# Patient Record
Sex: Female | Born: 1950
Health system: Western US, Academic
[De-identification: ages and names within clinical notes are randomized; demographics above are authoritative.]

---

## 2015-01-03 ENCOUNTER — Telehealth (HOSPITAL_BASED_OUTPATIENT_CLINIC_OR_DEPARTMENT_OTHER): Payer: Self-pay | Admitting: Gastroenterology

## 2015-01-03 NOTE — Telephone Encounter (Signed)
(  TEXTING IS AN OPTION FOR [facility] ONLY)  Is this a [facility] clinic? No      RETURN CALL: Detailed message on voicemail only      SUBJECT:  General Message     REASON FOR REQUEST: Patient requests Dr Barbara Cower    MESSAGE: Patient had referral sent but wants to see Dr Barbara Cower. She would like a call back to discuss. Please follow up with patient. Thank you

## 2015-02-23 ENCOUNTER — Other Ambulatory Visit (HOSPITAL_BASED_OUTPATIENT_CLINIC_OR_DEPARTMENT_OTHER): Payer: Self-pay | Admitting: Gastroenterology

## 2015-02-23 DIAGNOSIS — B181 Chronic viral hepatitis B without delta-agent: Secondary | ICD-10-CM

## 2015-02-26 ENCOUNTER — Encounter (HOSPITAL_BASED_OUTPATIENT_CLINIC_OR_DEPARTMENT_OTHER): Payer: Self-pay

## 2015-02-26 ENCOUNTER — Ambulatory Visit: Payer: No Typology Code available for payment source | Attending: Gastroenterology | Admitting: Gastroenterology

## 2015-02-26 ENCOUNTER — Other Ambulatory Visit (HOSPITAL_BASED_OUTPATIENT_CLINIC_OR_DEPARTMENT_OTHER): Payer: Self-pay | Admitting: Gastroenterology

## 2015-02-26 ENCOUNTER — Ambulatory Visit (HOSPITAL_BASED_OUTPATIENT_CLINIC_OR_DEPARTMENT_OTHER): Payer: No Typology Code available for payment source

## 2015-02-26 VITALS — BP 145/81 | HR 47 | Temp 97.3°F | Ht 65.5 in | Wt 170.6 lb

## 2015-02-26 DIAGNOSIS — B181 Chronic viral hepatitis B without delta-agent: Secondary | ICD-10-CM | POA: Insufficient documentation

## 2015-02-26 DIAGNOSIS — B1910 Unspecified viral hepatitis B without hepatic coma: Secondary | ICD-10-CM | POA: Insufficient documentation

## 2015-02-26 DIAGNOSIS — N281 Cyst of kidney, acquired: Secondary | ICD-10-CM | POA: Insufficient documentation

## 2015-02-26 LAB — CONFIRMATION SAMPLE INFO:

## 2015-02-26 LAB — BASIC METABOLIC PANEL
Anion Gap: 5 (ref 4–12)
Calcium: 9.4 mg/dL (ref 8.9–10.2)
Carbon Dioxide, Total: 32 meq/L (ref 22–32)
Chloride: 102 meq/L (ref 98–108)
Creatinine: 0.7 mg/dL (ref 0.38–1.02)
GFR, Calc, African American: >60 mL/min (ref >59)
GFR, Calc, European American: >60 mL/min (ref >59)
Glucose: 97 mg/dL (ref 62–125)
Potassium: 3.2 meq/L — ABNORMAL LOW (ref 3.6–5.2)
Sodium: 139 meq/L (ref 135–145)
Urea Nitrogen: 12 mg/dL (ref 8–21)

## 2015-02-26 LAB — HEPATITIS B ABS - HBCAB,HBSAB
Hepatitis B Core Ab: REACTIVE — AB
Hepatitis B Surf Antibody Intl Units: <8 [IU]
Hepatitis B Surface Ab: NONREACTIVE

## 2015-02-26 LAB — IRON BINDING CAPACITY (W/IRON, TRANSFERRIN & TRANSF SAT)
Iron, SRM: 91 ug/dL (ref 31–171)
Total Iron Binding Capacity: 371 ug/dL (ref 270–535)
Transferrin Saturation: 25 % (ref 10–45)
Transferrin: 265 mg/dL (ref 192–382)

## 2015-02-26 LAB — CBC (HEMOGRAM)
Hematocrit: 40 % (ref 36–45)
Hemoglobin: 13.4 g/dL (ref 11.5–15.5)
MCH: 30.6 pg (ref 27.3–33.6)
MCHC: 33.3 g/dL (ref 32.2–36.5)
MCV: 92 fL (ref 81–98)
Platelet Count: 213 10*3/uL (ref 150–400)
RBC: 4.38 10*6/uL (ref 3.80–5.00)
RDW-CV: 11.4 % — ABNORMAL LOW (ref 11.6–14.4)
WBC: 5.5 10*3/uL (ref 4.3–10.0)

## 2015-02-26 LAB — HEPATIC FUNCTION PANEL
ALT (GPT): 16 U/L (ref 7–33)
AST (GOT): 20 U/L (ref 9–38)
Albumin: 4.1 g/dL (ref 3.5–5.2)
Alkaline Phosphatase (Total): 47 U/L (ref 31–132)
Bilirubin (Direct): 0.2 mg/dL (ref 0.0–0.3)
Bilirubin (Total): 1 mg/dL (ref 0.2–1.3)
Protein (Total): 6.9 g/dL (ref 6.0–8.2)

## 2015-02-26 LAB — HEPATITIS B SURFACE AG & AB
Hepatitis B Surf Antibody Intl Units: <8 [IU]
Hepatitis B Surface Ab: NONREACTIVE
Hepatitis B Surface Antigen w/Reflex: REACTIVE — AB

## 2015-02-26 LAB — FERRITIN: Ferritin: 159 ng/mL (ref 10–180)

## 2015-02-26 LAB — HEPATITIS B SURF AG CONF-NEUT
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
## 2015-02-26 NOTE — Patient Instructions (Addendum)
-   You have positive surface antigen, but no signs of advanced liver disease.    - Because of the small, but real cancer risk, we suggest checking your virus counts and also have a liver ultrasound.    - consider minimizing alcohol even more and watch body weight    - We will call you with the results and decide on the follow up plan.

## 2015-02-27 ENCOUNTER — Telehealth (HOSPITAL_BASED_OUTPATIENT_CLINIC_OR_DEPARTMENT_OTHER): Payer: Self-pay

## 2015-02-27 LAB — ALPHA FETOPROTEIN, NONMATERNAL: Alpha Fetoprotein (Nonmaternal): 6 ng/mL (ref 0.0–8.5)

## 2015-02-27 NOTE — Telephone Encounter (Signed)
Left message for patient with the results. Told her we would call her as soon as the Hep B viral load results.

## 2015-02-27 NOTE — Telephone Encounter (Signed)
-----   Message from Caralee AtesLei Yu, MD sent at 02/27/2015 11:46 AM PDT -----  Please let Tiffany Pierce know that her ultrasound was completely normal. We are still waiting on her hbv pcr result. Thanks!

## 2015-02-28 LAB — HEPATITIS B QUANTITATIVE
HBV DNA IU/mL (Log 10): 2.3 {Log}
Hepatitis B Quant Result: 200 [IU]/mL — AB

## 2015-02-28 LAB — HEPATITIS B E AG AND AB BY EIA
Hepatitis B e Ab: POSITIVE
Hepatitis B e Ag: NEGATIVE

## 2015-03-02 ENCOUNTER — Telehealth (HOSPITAL_BASED_OUTPATIENT_CLINIC_OR_DEPARTMENT_OTHER): Payer: Self-pay | Admitting: Gastroenterology

## 2015-03-02 LAB — HEPATITIS B QUANTITATIVE

## 2015-03-02 NOTE — Telephone Encounter (Signed)
Ley Dr. Cathie HoopsYu know that pt's Hep B PCR is resulted and is 200. Per Dr. Cathie HoopsYu, he will call the patient.

## 2015-03-02 NOTE — Telephone Encounter (Signed)
Tiffany Pierce called into the clinic to ask if her lab results are in yet.  She says she received a call from Tiffany Pierce on Monday and is just following up on that call.  She wants to make sure her PCP gets the results by her upcoming appointment on Monday.    I looked up the orders and told the patient that the results were in for all of the tests ordered on 10/17.  I told her that I was not qualified to explain any results but not to worry about them being analyzed in time.      She said she would like to be called back today at [phone] after 2pm.    I also told her that she can view test results on [facility] and will provide her with a temporary login, emailed to Tiffany Pierce.  I told her this was a good solution in case she does not hear back today.    Routing to Hepatology clinical pool.  Thanks.

## 2015-03-10 ENCOUNTER — Other Ambulatory Visit: Payer: Self-pay

## 2019-08-05 ENCOUNTER — Encounter (HOSPITAL_COMMUNITY): Payer: Self-pay

## 2019-08-06 NOTE — Historical Transplant Communications (Signed)
[place], Burger [phone])  Transplant Communications  ____________________________________________________________________________________    Date: 02/28/2015  Type: Medical info  Entry By: Stevan Born  Re:  outside note to scan of type  - Outside Referrals: Scanned referral records to [facility] 02/26/2015.  ____________________________________________________________________________________    Date: 02/05/2015  Type: Action  Entry By: ccalhoun  Appt sched for 02/26/15 at 1:00pm in [facility].  ____________________________________________________________________________________    Date: 10/19/2014  Type: Action  Entry By: ccalhoun  Chart filed in closed section  ____________________________________________________________________________________    Date: 10/19/2014  Type: Action  Entry By: ccalhoun  Fax no contact close referral letter to ref MD  ____________________________________________________________________________________    Date: 10/19/2014  Type: Action  Entry By: ccalhoun  CCR attempts x 3  ____________________________________________________________________________________    Date: 07/28/2014  Type: Action  Entry By: Elisabeth Pigeon  Delivered to [facility].  ____________________________________________________________________________________    Date: 07/26/2014  Type: Referral  Entry By: Elisabeth Pigeon  Processed 1st liver referral, patient notified and confirmation sent.

## 2021-01-09 ENCOUNTER — Telehealth (HOSPITAL_BASED_OUTPATIENT_CLINIC_OR_DEPARTMENT_OTHER): Payer: Self-pay | Admitting: Gastroenterology

## 2021-01-09 NOTE — Telephone Encounter (Signed)
RETURN CALL: Voicemail - Detailed Message      SUBJECT:  Urgent per caller  Appointment Request     REASON FOR VISIT: High Bilirubin    PREFERRED DATE/TIME: 02/05/2021-02/07/2021    ADDITIONAL INFORMATION:     Previous patient of Dr Bethanne Ginger. Patient now resides in [facility]. She's asking if it would be possible for her to see Dr Cathie Hoops when she's in town between the dates: 02/05/2021-02/07/2021?Marland Kitchen     Please contact patient to advise.    Thank you

## 2021-01-11 NOTE — Telephone Encounter (Signed)
Patient was calling to check on status of possible appointment.

## 2022-08-01 IMAGING — MR MRI LUMBAR WITHOUT CONTRAST
2 of 8 series · 5 of 48 positions shown · non-contrast
Comparison: Lumbar radiograph June 27, 2022

MRI LUMBAR WITHOUT CONTRAST , 08/01/2022 [DATE]: 
CLINICAL INDICATION: Scoliosis with primarily lower back pain.
TECHNIQUE: Multiplanar, multiecho position MR images of the lumbar spine were 
performed without contrast.

[Series 2: T2 · sagittal · 4.0mm · 0.26mm/px · 3 of 20 slices shown (1 of 2)]
[im 1/20]
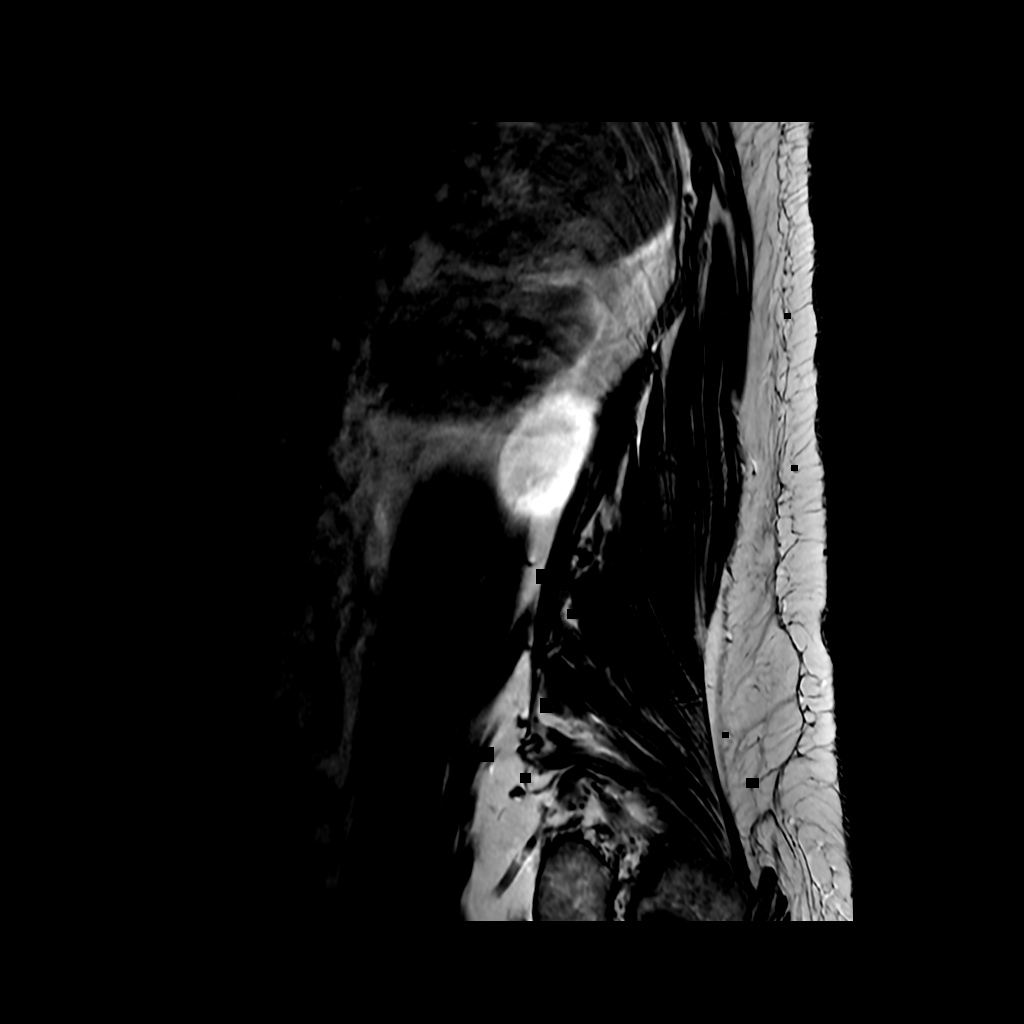
[im 13/20]
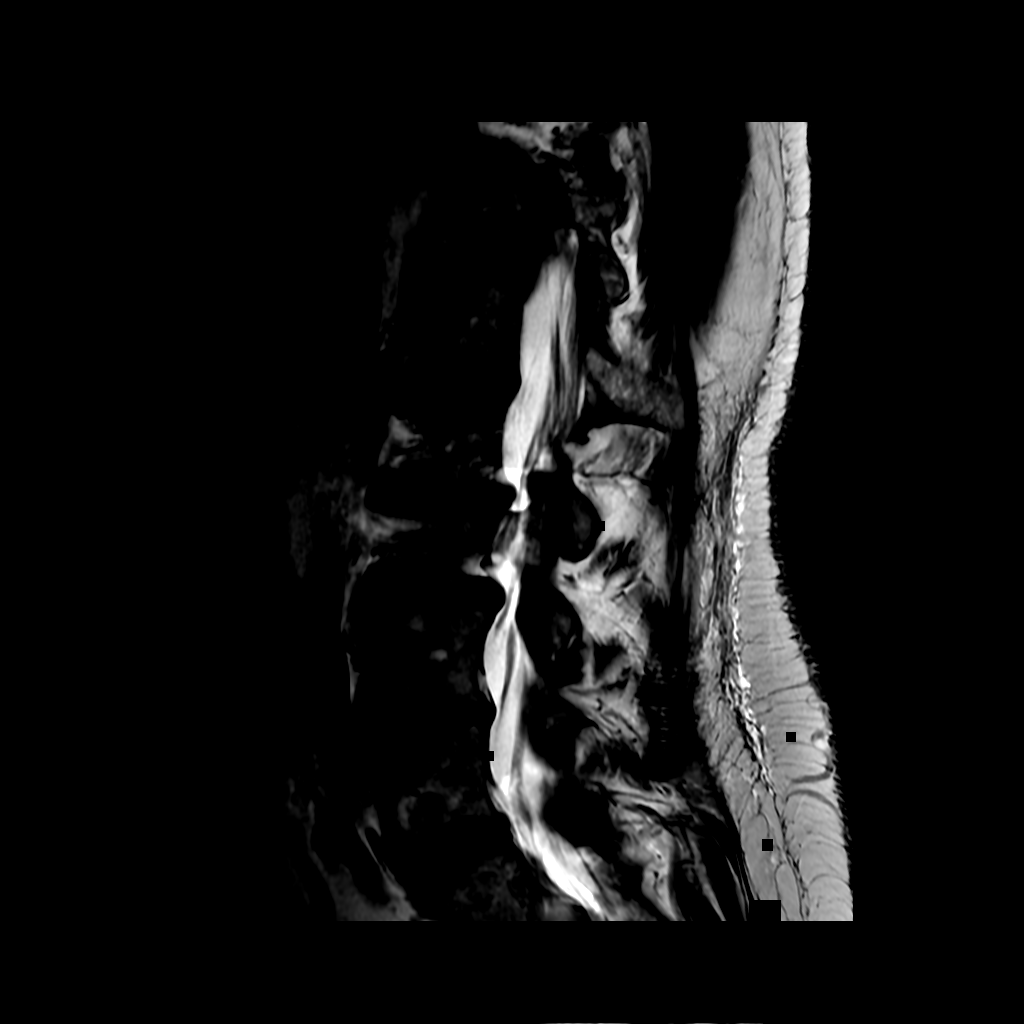
[im 20/20]
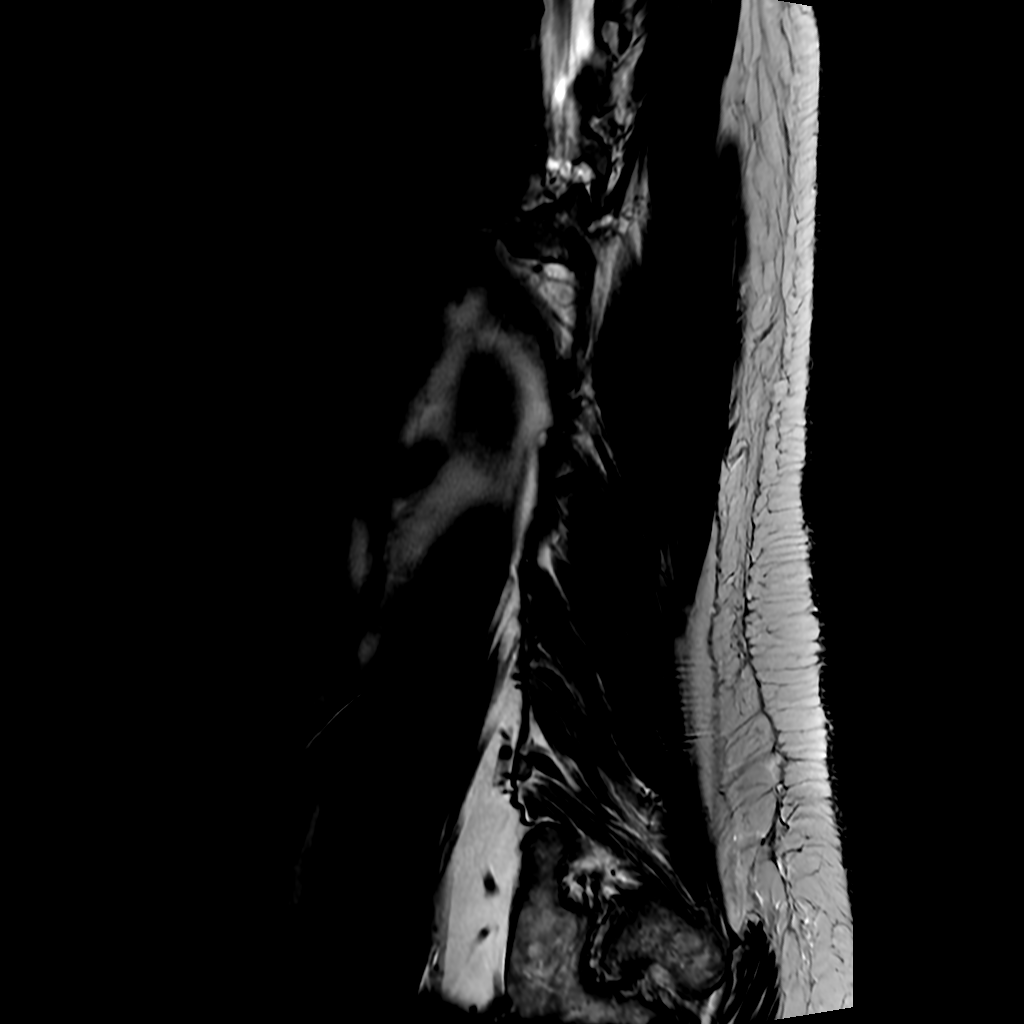

[Series 5: T2 · axial · 4.0mm · 0.18mm/px · z∈[+113,+178]mm · 2 of 35 slices shown (2 of 2)]
[im 5/35]
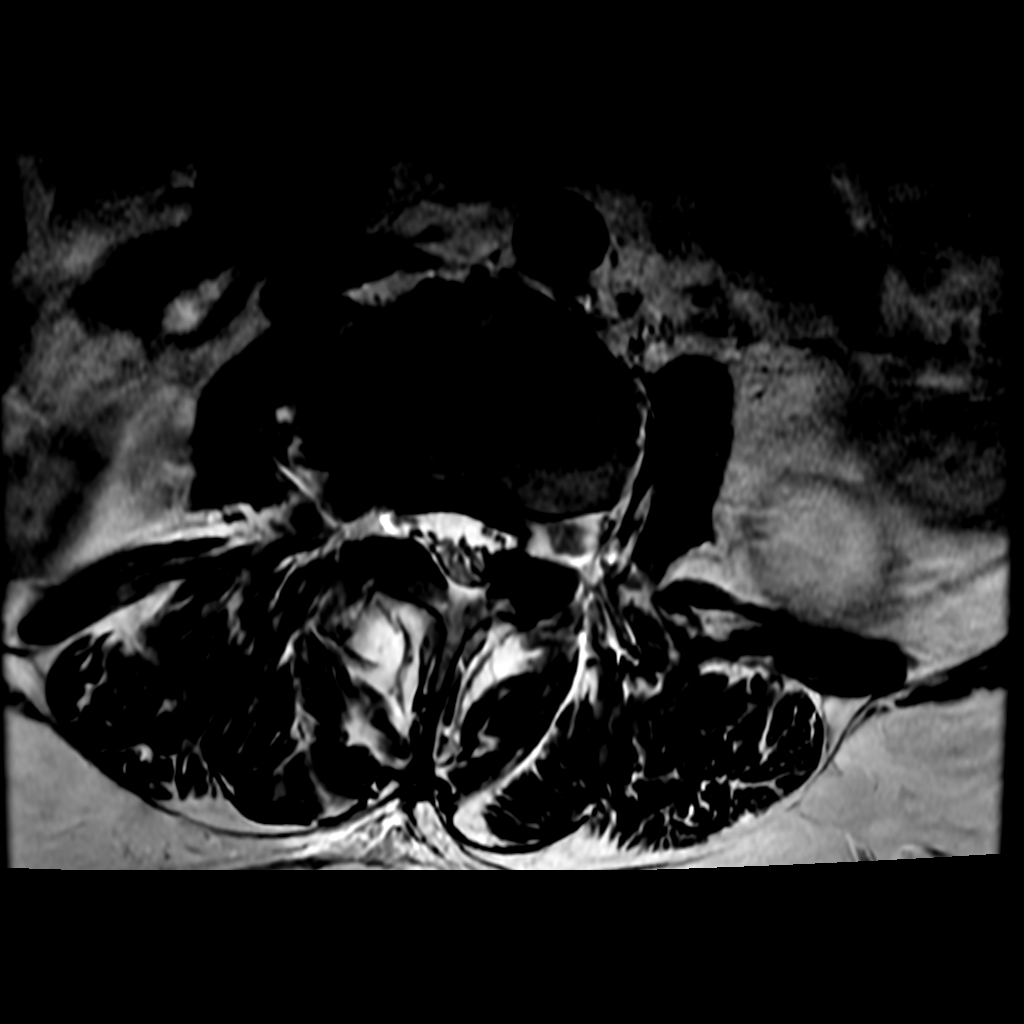
[im 20/35]
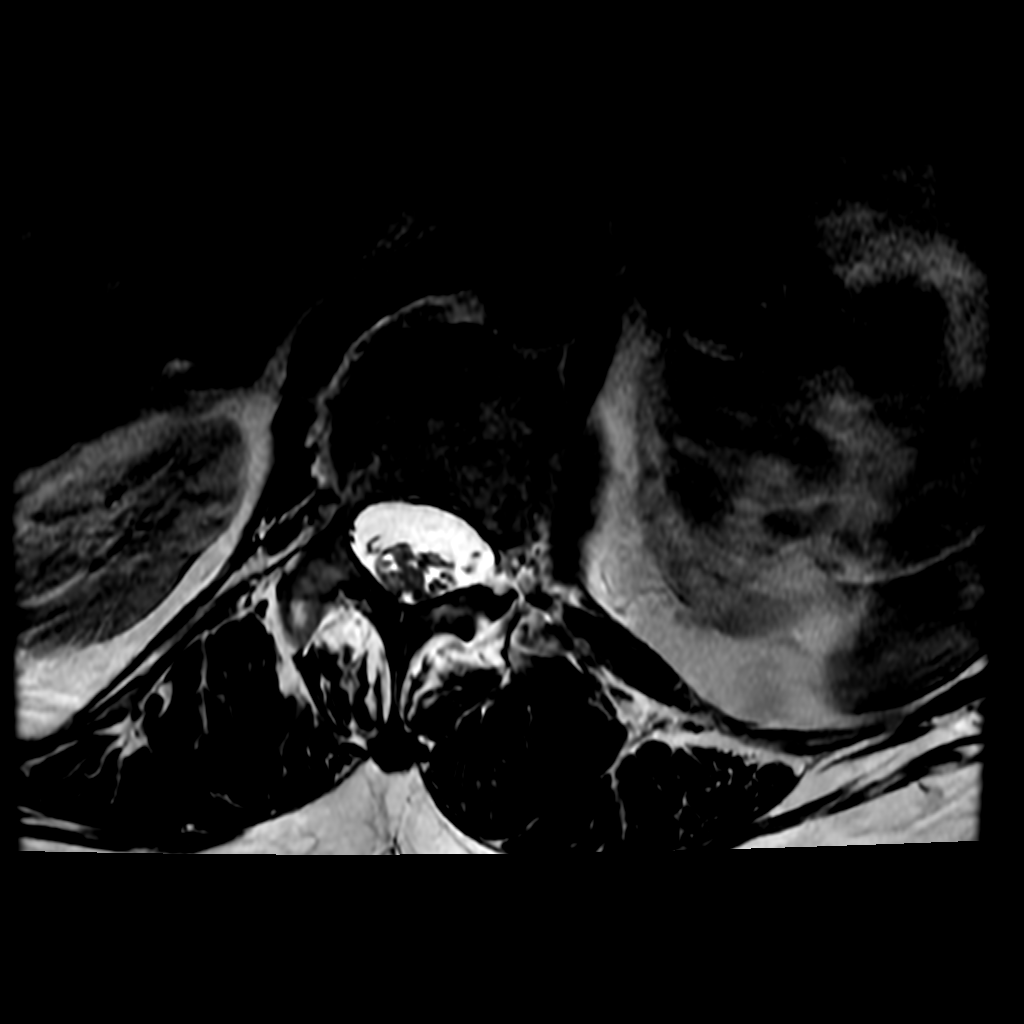

[5 of 48 positions shown; findings below may reference images not displayed]

FINDINGS: The recent radiograph showed mild to moderate thoracolumbar dextroscoliosis. 
This is not completely evaluated on todays MRI localizer. 
Lumbar vertebral heights are intact. There is marked disc narrowing at L2-3 with 
Modic type I change. There is mild Modic type I change along the anterior L3-4 
interspace, also anterior L1-2 interspace. The conus terminates opposite T12-L1. 
No evidence for malignancy. 
At L5-S1 the canal is open. Marked facet degenerative changes, worse on the 
left. There is degenerative reactive edema/synovitis in the left facet joint 
extending into the left L5 pedicle Foramina are open. 
At L4-5 there is minimal broad-based disc bulge. The canal and foramina are 
open. Moderate facet degenerative change. 
At L3-4 there is slight anterolisthesis. Mild disc bulge. Borderline-mild canal 
stenosis. There is mild to moderate right foraminal stenosis, left foramen open. 
At L2-3 the canal is open. Foramina are open. Moderate facet change. 
At L1-2 the canal and foramina are open. 
At T12-L1 the canal and foramina are open. 
Coronal localizer shows at least moderate bilateral hip degenerative change, not 
completely evaluated.
IMPRESSION: Degenerative changes associated with mild to moderate thoracolumbar 
dextroscoliosis. There is no significant lumbar canal stenosis. There is right 
L3-4 foraminal stenosis with mild encroachment on the distal right L3 nerve 
root, potentially worsening with weightbearing. There is Modic type I change 
which appears most pronounced at L2-3. 
Facet degenerative changes are most pronounced on the left at L5-S1. There is 
degenerative reactive edema and intra-articular synovitis. Edema extends into 
the left L5 pedicle. Potentially, facet steroid injection could be of benefit; 
clinical correlation. 
No evidence for fracture. 
Bilateral renal cysts are not completely evaluated. There is inhomogeneous 
signal in the left lower pole cyst. Renal ultrasound recommended. 
There appears to be at least moderate bilateral hip degenerative change. If 
indicated radiographs and/or MRI of the hips would be useful.

## 2022-09-17 IMAGING — MR MRI CERVICAL SPINE WITHOUT CONTRAST
3 of 6 series · 9 of 48 positions shown · IV contrast (gadolinium)
Comparison: None.

MRI CERVICAL SPINE WITHOUT CONTRAST , 09/17/2022 [DATE]: 
CLINICAL INDICATION: Neck and shoulder pain.
TECHNIQUE: Multiplanar, multiecho position MR images of the cervical spine were 
performed without intravenous gadolinium enhancement. Patient was scanned on a 
3.0 T magnet.

[Series 2: T2 · sagittal · 3.0mm · 0.39mm/px · 3 of 18 slices shown (1 of 3)]
[im 3/18]
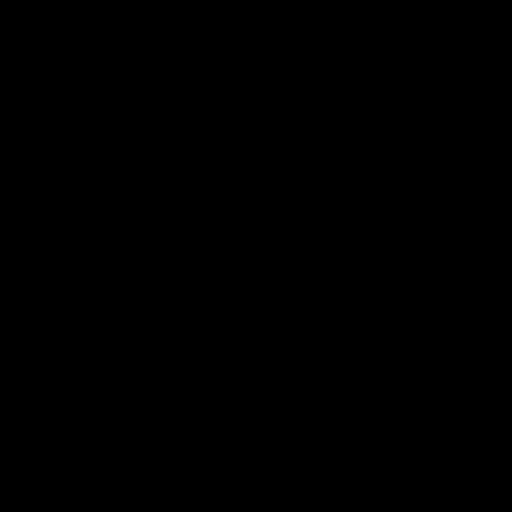
[im 9/18]
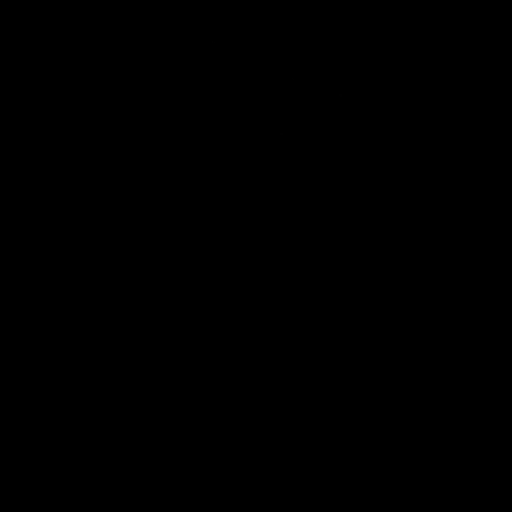
[im 15/18]
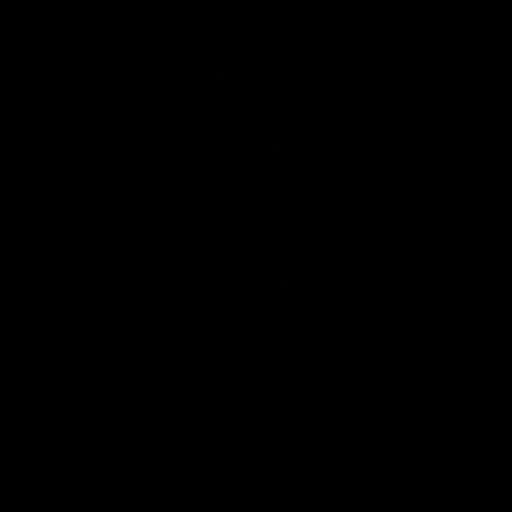

[Series 5: T2 · axial · 4.0mm · 0.27mm/px · z∈[-48,+26]mm · 3 of 25 slices shown (2 of 3)]
[im 3/25]
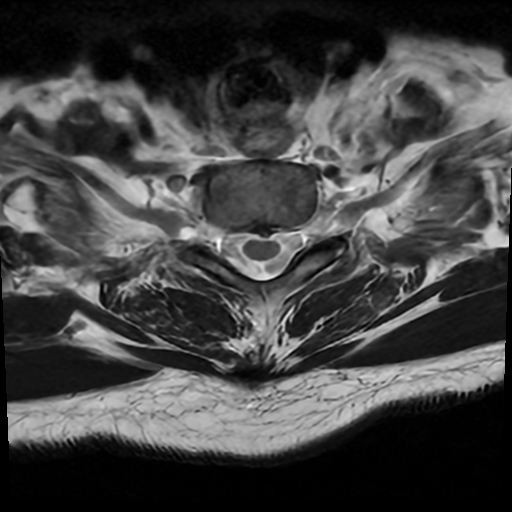
[im 14/25]
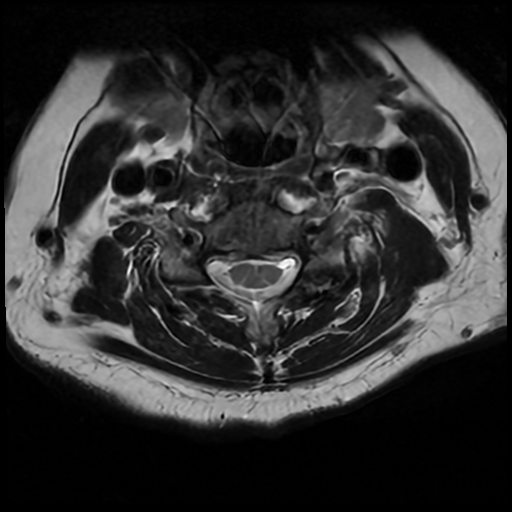
[im 22/25]
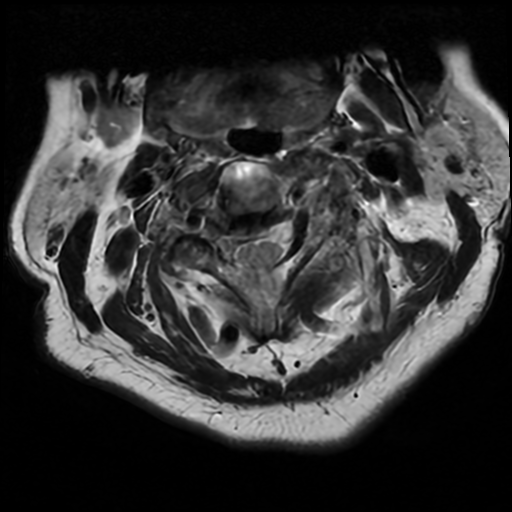

[Series 7: T2 · axial · 3.0mm · 0.14mm/px · z∈[-24,+14]mm · 3 of 18 slices shown (3 of 3)]
[im 3/18]
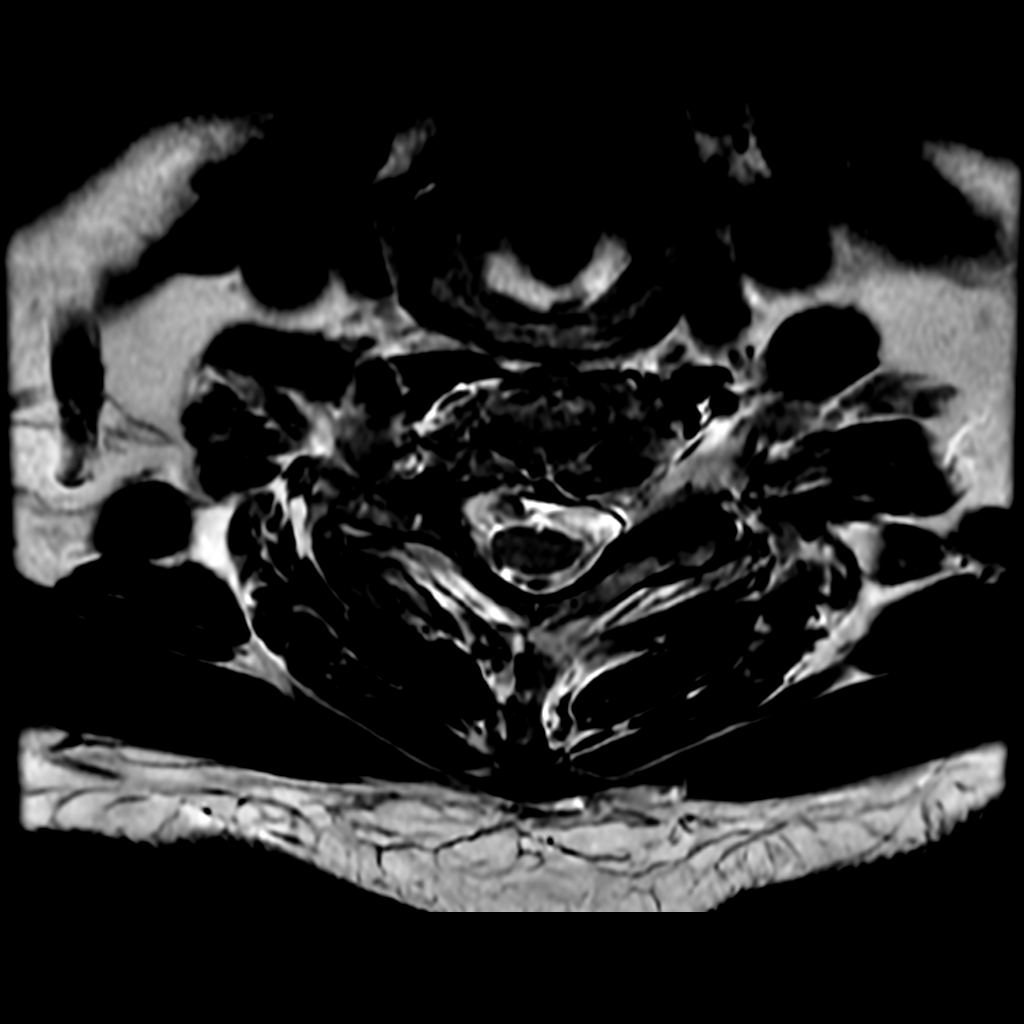
[im 9/18]
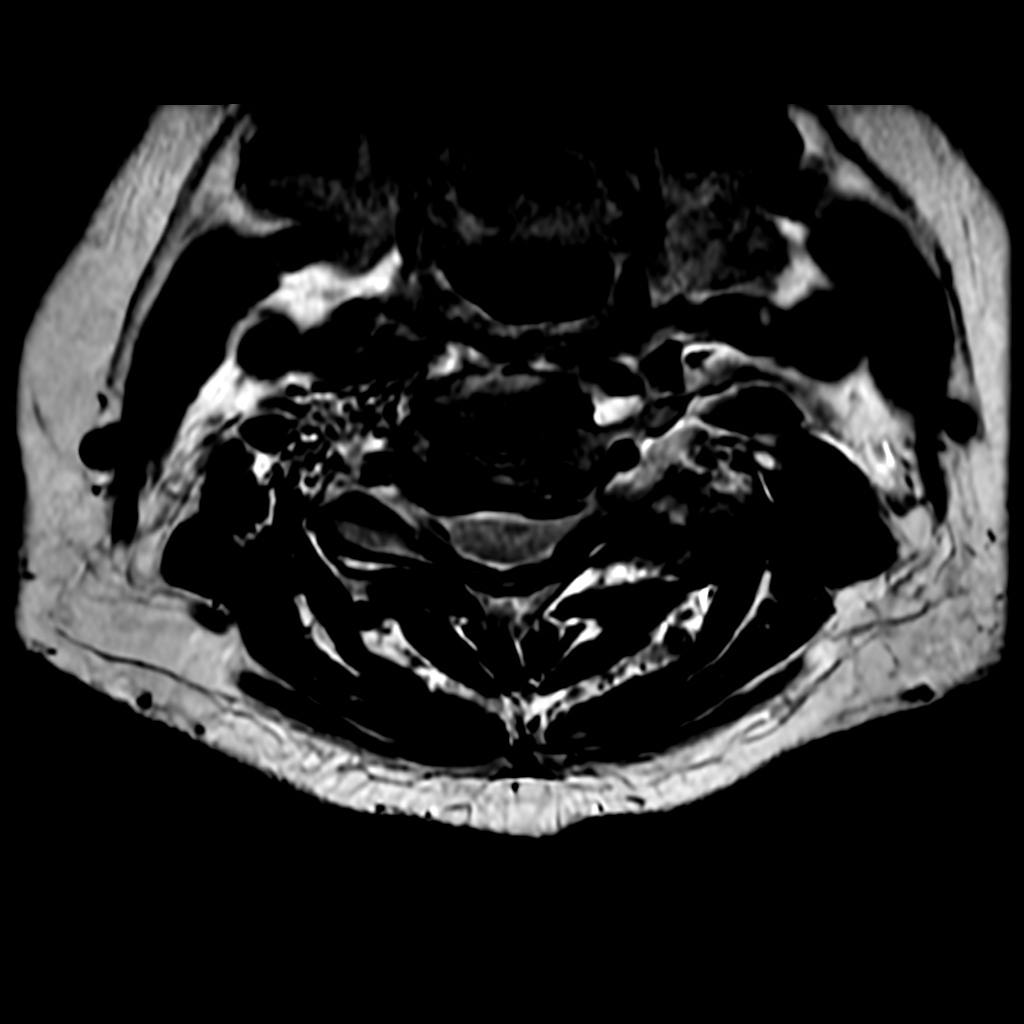
[im 15/18]
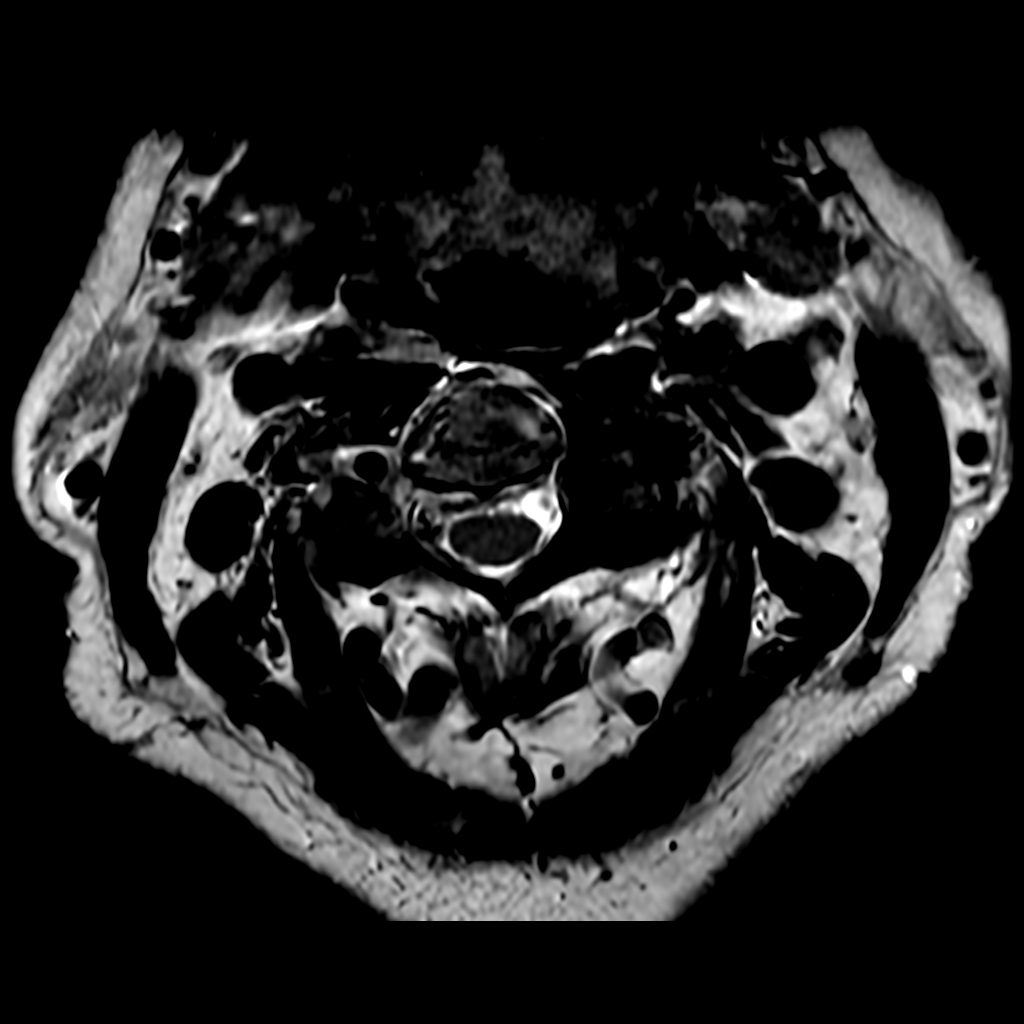

[9 of 48 positions shown; findings below may reference images not displayed]

FINDINGS: -------------------------------------------------------------------------------- 
----------------- 
GENERAL: 
ALIGNMENT: Reversal of normal cervical lordosis. Mild anterolisthesis of C4 on 
C5. Mild retrolisthesis of C5 on C6, C6 on C7. Mild anterolisthesis of C7 on T1. 
VERTEBRAL BODY HEIGHT: Normal.  
MARROW SIGNAL: No focal suspect signal abnormality. 
CORD SIGNAL: Normal.  
ADDITIONAL FINDINGS: None. 
-------------------------------------------------------------------------------- 
---------------- 
SEGMENTAL: 
CRANIOCERVICAL JUNCTION: No significant stenosis. 
C2-C3: Bilateral facet hypertrophy, leftward of the right. No significant 
central canal narrowing. Mild left and no significant right neural foraminal 
narrowing. 
C3-C4: Disc osteophyte complex with left uncovertebral joint hypertrophy. Left 
facet hypertrophy. Mild deformity of ventral cord. Mild central canal narrowing. 
Mild left neural foraminal narrowing. No significant right neural foraminal 
narrowing. 
C4-C5: Disc osteophyte complex with bilateral uncovertebral joint hypertrophy. 
Extensive left facet hypertrophy. Deformity of ventral cord with mild central 
canal narrowing. Moderate left and mild right neural foraminal narrowing. 
C5-C6: Disc osteophyte complex with bilateral uncovertebral joint hypertrophy. 
Mild deformity of the ventral cord. Mild central canal narrowing. Moderate left 
greater than right neural foraminal narrowing. 
C6-C7: Disc osteophyte complex with bilateral uncovertebral joint hypertrophy. 
Mild central canal narrowing. Moderate bilateral neural foraminal narrowing. 
C7-T1: Disc osteophyte complex eccentric to the left with left paracentral 
shallow disc herniation. Deformity of the left ventral cord. Mild left-sided 
central canal narrowing. Bilateral uncovertebral joint and facet hypertrophy. 
Moderate left greater than right neural foraminal narrowing. 
T1-T2: Central disc herniation extending superiorly. Mild deformity of ventral 
cord. Mild central canal narrowing. No significant neural foraminal narrowing. 
-------------------------------------------------------------------------------- 
---------------
IMPRESSION: 1.  Discogenic/degenerative changes as above. 
2.  Cord signal abnormality: None. 
3.  Cord deformity: C3-C4, C4-C5, C5-C6, C7-T1, T1-T2 
4.  No severe neural foraminal narrowing at any level.
